# Patient Record
Sex: Female | Born: 1970 | Race: Black or African American | Hispanic: No | Marital: Married | Smoking: Never smoker
Health system: Southern US, Community
[De-identification: ages and names within clinical notes are randomized; demographics above are authoritative.]

## PROBLEM LIST (undated history)

## (undated) DIAGNOSIS — I10 Essential (primary) hypertension: Secondary | ICD-10-CM

## (undated) DIAGNOSIS — F419 Anxiety disorder, unspecified: Secondary | ICD-10-CM

## (undated) DIAGNOSIS — E236 Other disorders of pituitary gland: Secondary | ICD-10-CM

## (undated) DIAGNOSIS — M1990 Unspecified osteoarthritis, unspecified site: Secondary | ICD-10-CM

## (undated) HISTORY — PX: COLONOSCOPY: SHX174

---

## 2000-06-03 ENCOUNTER — Other Ambulatory Visit: Admission: RE | Admit: 2000-06-03 | Discharge: 2000-06-03 | Payer: Self-pay | Admitting: *Deleted

## 2000-06-14 ENCOUNTER — Ambulatory Visit (HOSPITAL_COMMUNITY): Admission: RE | Admit: 2000-06-14 | Discharge: 2000-06-14 | Payer: Self-pay | Admitting: *Deleted

## 2000-06-14 ENCOUNTER — Encounter: Payer: Self-pay | Admitting: *Deleted

## 2001-01-20 ENCOUNTER — Other Ambulatory Visit: Admission: RE | Admit: 2001-01-20 | Discharge: 2001-01-20 | Payer: Self-pay | Admitting: Obstetrics and Gynecology

## 2001-08-26 ENCOUNTER — Inpatient Hospital Stay (HOSPITAL_COMMUNITY): Admission: AD | Admit: 2001-08-26 | Discharge: 2001-08-28 | Payer: Self-pay | Admitting: Obstetrics and Gynecology

## 2001-09-01 ENCOUNTER — Encounter: Admission: RE | Admit: 2001-09-01 | Discharge: 2001-10-01 | Payer: Self-pay | Admitting: Obstetrics and Gynecology

## 2001-10-06 ENCOUNTER — Other Ambulatory Visit: Admission: RE | Admit: 2001-10-06 | Discharge: 2001-10-06 | Payer: Self-pay | Admitting: Obstetrics and Gynecology

## 2002-10-09 ENCOUNTER — Other Ambulatory Visit: Admission: RE | Admit: 2002-10-09 | Discharge: 2002-10-09 | Payer: Self-pay | Admitting: Obstetrics and Gynecology

## 2003-12-20 ENCOUNTER — Other Ambulatory Visit: Admission: RE | Admit: 2003-12-20 | Discharge: 2003-12-20 | Payer: Self-pay | Admitting: Obstetrics and Gynecology

## 2007-09-19 ENCOUNTER — Encounter: Admission: RE | Admit: 2007-09-19 | Discharge: 2007-09-19 | Payer: Self-pay | Admitting: Obstetrics and Gynecology

## 2009-06-16 ENCOUNTER — Inpatient Hospital Stay (HOSPITAL_COMMUNITY): Admission: AD | Admit: 2009-06-16 | Discharge: 2009-06-16 | Payer: Self-pay | Admitting: Obstetrics and Gynecology

## 2009-06-21 ENCOUNTER — Inpatient Hospital Stay (HOSPITAL_COMMUNITY): Admission: AD | Admit: 2009-06-21 | Discharge: 2009-06-23 | Payer: Self-pay | Admitting: Obstetrics & Gynecology

## 2011-01-22 LAB — COMPREHENSIVE METABOLIC PANEL
ALT: 94 U/L — ABNORMAL HIGH (ref 0–35)
AST: 54 U/L — ABNORMAL HIGH (ref 0–37)
Albumin: 2.5 g/dL — ABNORMAL LOW (ref 3.5–5.2)
Alkaline Phosphatase: 186 U/L — ABNORMAL HIGH (ref 39–117)
BUN: 6 mg/dL (ref 6–23)
CO2: 25 mEq/L (ref 19–32)
Calcium: 8.7 mg/dL (ref 8.4–10.5)
Chloride: 105 mEq/L (ref 96–112)
Creatinine, Ser: 1.01 mg/dL (ref 0.4–1.2)
GFR calc Af Amer: >60 mL/min (ref >60)
GFR calc non Af Amer: >60 mL/min (ref >60)
Glucose, Bld: 71 mg/dL (ref 70–99)
Potassium: 4 mEq/L (ref 3.5–5.1)
Sodium: 138 mEq/L (ref 135–145)
Total Bilirubin: 0.4 mg/dL (ref 0.3–1.2)
Total Protein: 6.1 g/dL (ref 6.0–8.3)

## 2011-01-22 LAB — CBC
HCT: 32 % — ABNORMAL LOW (ref 36.0–46.0)
HCT: 33.1 % — ABNORMAL LOW (ref 36.0–46.0)
Hemoglobin: 10.7 g/dL — ABNORMAL LOW (ref 12.0–15.0)
Hemoglobin: 11 g/dL — ABNORMAL LOW (ref 12.0–15.0)
MCHC: 33.2 g/dL (ref 30.0–36.0)
MCHC: 33.5 g/dL (ref 30.0–36.0)
MCV: 98.2 fL (ref 78.0–100.0)
MCV: 98.3 fL (ref 78.0–100.0)
Platelets: 145 10*3/uL — ABNORMAL LOW (ref 150–400)
Platelets: 219 10*3/uL (ref 150–400)
RBC: 3.26 MIL/uL — ABNORMAL LOW (ref 3.87–5.11)
RBC: 3.37 MIL/uL — ABNORMAL LOW (ref 3.87–5.11)
RDW: 13.7 % (ref 11.5–15.5)
RDW: 14.3 % (ref 11.5–15.5)
WBC: 11.8 10*3/uL — ABNORMAL HIGH (ref 4.0–10.5)
WBC: 14.7 10*3/uL — ABNORMAL HIGH (ref 4.0–10.5)

## 2012-03-03 ENCOUNTER — Encounter (HOSPITAL_BASED_OUTPATIENT_CLINIC_OR_DEPARTMENT_OTHER): Payer: Self-pay | Admitting: *Deleted

## 2012-03-03 ENCOUNTER — Emergency Department (HOSPITAL_BASED_OUTPATIENT_CLINIC_OR_DEPARTMENT_OTHER)
Admission: EM | Admit: 2012-03-03 | Discharge: 2012-03-03 | Disposition: A | Discharge status: Home / Self Care | Payer: BC Managed Care – PPO | Attending: Emergency Medicine | Admitting: Emergency Medicine

## 2012-03-03 DIAGNOSIS — R42 Dizziness and giddiness: Secondary | ICD-10-CM | POA: Insufficient documentation

## 2012-03-03 DIAGNOSIS — I10 Essential (primary) hypertension: Secondary | ICD-10-CM | POA: Insufficient documentation

## 2012-03-03 DIAGNOSIS — R51 Headache: Secondary | ICD-10-CM

## 2012-03-03 HISTORY — DX: Other disorders of pituitary gland: E23.6

## 2012-03-03 HISTORY — DX: Essential (primary) hypertension: I10

## 2012-03-03 LAB — DIFFERENTIAL
Basophils Relative: 0 % (ref 0–1)
Eosinophils Absolute: 0.1 10*3/uL (ref 0.0–0.7)
Lymphs Abs: 3.1 10*3/uL (ref 0.7–4.0)
Monocytes Absolute: 0.6 10*3/uL (ref 0.1–1.0)
Monocytes Relative: 8 % (ref 3–12)

## 2012-03-03 LAB — BASIC METABOLIC PANEL
BUN: 15 mg/dL (ref 6–23)
Chloride: 106 mEq/L (ref 96–112)
Creatinine, Ser: 0.9 mg/dL (ref 0.50–1.10)
GFR calc Af Amer: >90 mL/min (ref >90)
Glucose, Bld: 105 mg/dL — ABNORMAL HIGH (ref 70–99)

## 2012-03-03 LAB — CBC
HCT: 36.7 % (ref 36.0–46.0)
Hemoglobin: 13.2 g/dL (ref 12.0–15.0)
MCH: 29.9 pg (ref 26.0–34.0)
MCHC: 36 g/dL (ref 30.0–36.0)
RBC: 4.42 MIL/uL (ref 3.87–5.11)

## 2012-03-03 MED ORDER — SODIUM CHLORIDE 0.9 % IV SOLN
Freq: Once | INTRAVENOUS | Status: AC
  Administered 2012-03-03: 22:00:00 via INTRAVENOUS

## 2012-03-03 MED ORDER — METOCLOPRAMIDE HCL 5 MG/ML IJ SOLN
10.0000 mg | Freq: Once | INTRAMUSCULAR | Status: AC
  Administered 2012-03-03: 10 mg via INTRAVENOUS
  Filled 2012-03-03: qty 2

## 2012-03-03 MED ORDER — DIPHENHYDRAMINE HCL 50 MG/ML IJ SOLN
12.5000 mg | Freq: Once | INTRAMUSCULAR | Status: AC
  Administered 2012-03-03: 12.5 mg via INTRAVENOUS
  Filled 2012-03-03: qty 1

## 2012-03-03 NOTE — ED Notes (Signed)
EDNP Sofia back in to talk with pt prior to d/c

## 2012-03-03 NOTE — ED Notes (Signed)
PA at bedside.

## 2012-03-03 NOTE — ED Provider Notes (Signed)
History     CSN: [id number]  Arrival date & time 03/03/12  4098   First MD Initiated Contact with Patient 03/03/12 2055      Chief Complaint  Patient presents with  . Dizziness    (Consider location/radiation/quality/duration/timing/severity/associated sxs/prior treatment) Patient is a 42 y.o. female presenting with headaches. The history is provided by the patient. No language interpreter was used.  Headache  This is a new problem. The current episode started 3 to 5 hours ago. The problem occurs constantly. The problem has not changed since onset.The headache is associated with nothing. The pain is located in the right unilateral region. The quality of the pain is described as dull. The pain is at a severity of 5/10. The pain is moderate. The pain does not radiate. She has tried acetaminophen for the symptoms. The treatment provided no relief.  Pt has a history of htn,  Currently not on any medicines.  Pt has old rx for hctz.  Pt reports she had elevated bp tonight and took 2 hctz.    Past Medical History  Diagnosis Date  . Enlarged pituitary gland   . Hypertension     History reviewed. No pertinent past surgical history.  No family history on file.  History  Substance Use Topics  . Smoking status: Never Smoker   . Smokeless tobacco: Not on file  . Alcohol Use: No    OB History    Grav Para Term Preterm Abortions TAB SAB Ect Mult Living                  Review of Systems  Neurological: Positive for headaches.  All other systems reviewed and are negative.    Allergies  Review of patient's allergies indicates no known allergies.  Home Medications   Current Outpatient Rx  Name Route Sig Dispense Refill  . ACETAMINOPHEN 500 MG PO TABS Oral Take 1,000 mg by mouth every 6 (six) hours as needed. For pain    . HYDROCHLOROTHIAZIDE 12.5 MG PO CAPS Oral Take 12.5-25 mg by mouth daily.      BP 160/108  Pulse 69  Temp(Src) 98.3 F (36.8 C) (Oral)  Resp 18  Ht 5'  5" (1.651 m)  Wt 158 lb (71.668 kg)  BMI 26.29 kg/m2  SpO2 100%  Physical Exam  Nursing note and vitals reviewed. Constitutional: She is oriented to person, place, and time. She appears well-developed and well-nourished.  HENT:  Head: Normocephalic and atraumatic.  Right Ear: External ear normal.  Nose: Nose normal.  Mouth/Throat: Oropharynx is clear and moist.  Eyes: Conjunctivae and EOM are normal. Pupils are equal, round, and reactive to light.  Neck: Normal range of motion. Neck supple.  Cardiovascular: Normal rate and normal heart sounds.   Pulmonary/Chest: Effort normal.  Abdominal: Soft.  Musculoskeletal: Normal range of motion.  Neurological: She is alert and oriented to person, place, and time. She has normal reflexes.  Skin: Skin is warm.  Psychiatric: She has a normal mood and affect.    ED Course  Procedures (including critical care time)  Labs Reviewed - No data to display No results found.   No diagnosis found.    MDM  Pt given reglan and benadryl .   I will restart hctz.   Pt has a history of pituatary tumor.  I is scheduled to see her MD next week for recheck.        Lonia Skinner [place], [place] 03/03/12 2210

## 2012-03-03 NOTE — ED Notes (Addendum)
Pt reports headache with dizziness since this morning. Pt went to [facility] to check her BP and it was elevated. Denies N/V. No hx of migraines since a child. Pt has been under increased stress with work/family responsibilities.

## 2012-03-03 NOTE — ED Notes (Signed)
Pt took two HCTZ pills (that she had left over but doesn't take on a daily bases) @90min  ago with minimal relief.

## 2012-03-03 NOTE — Discharge Instructions (Signed)
Arterial Hypertension Arterial hypertension (high blood pressure) is a condition of elevated pressure in your blood vessels. Hypertension over a long period of time is a risk factor for strokes, heart attacks, and heart failure. It is also the leading cause of kidney (renal) failure.  CAUSES   In Adults -- Over 90% of all hypertension has no known cause. This is called essential or primary hypertension. In the other 10% of people with hypertension, the increase in blood pressure is caused by another disorder. This is called secondary hypertension. Important causes of secondary hypertension are:   Heavy alcohol use.   Obstructive sleep apnea.   Hyperaldosterosim (Conn's syndrome).   Steroid use.   Chronic kidney failure.   Hyperparathyroidism.   Medications.   Renal artery stenosis.   Pheochromocytoma.   Cushing's disease.   Coarctation of the aorta.   Scleroderma renal crisis.   Licorice (in excessive amounts).   Drugs (cocaine, methamphetamine).  Your caregiver can explain any items above that apply to you.  In Children -- Secondary hypertension is more common and should always be considered.   Pregnancy -- Few women of childbearing age have high blood pressure. However, up to 10% of them develop hypertension of pregnancy. Generally, this will not harm the woman. It may be a sign of 3 complications of pregnancy: preeclampsia, HELLP syndrome, and eclampsia. Follow up and control with medication is necessary.  SYMPTOMS   This condition normally does not produce any noticeable symptoms. It is usually found during a routine exam.   Malignant hypertension is a late problem of high blood pressure. It may have the following symptoms:   Headaches.   Blurred vision.   End-organ damage (this means your kidneys, heart, lungs, and other organs are being damaged).   Stressful situations can increase the blood pressure. If a person with normal blood pressure has their blood  pressure go up while being seen by their caregiver, this is often termed "white coat hypertension." Its importance is not known. It may be related with eventually developing hypertension or complications of hypertension.   Hypertension is often confused with mental tension, stress, and anxiety.  DIAGNOSIS  The diagnosis is made by 3 separate blood pressure measurements. They are taken at least 1 week apart from each other. If there is organ damage from hypertension, the diagnosis may be made without repeat measurements. Hypertension is usually identified by having blood pressure readings:  Above 140/90 mmHg measured in both arms, at 3 separate times, over a couple weeks.   Over 130/80 mmHg should be considered a risk factor and may require treatment in patients with diabetes.  Blood pressure readings over 120/80 mmHg are called "pre-hypertension" even in non-diabetic patients. To get a true blood pressure measurement, use the following guidelines. Be aware of the factors that can alter blood pressure readings.  Take measurements at least 1 hour after caffeine.   Take measurements 30 minutes after smoking and without any stress. This is another reason to quit smoking - it raises your blood pressure.   Use a proper cuff size. Ask your caregiver if you are not sure about your cuff size.   Most home blood pressure cuffs are automatic. They will measure systolic and diastolic pressures. The systolic pressure is the pressure reading at the start of sounds. Diastolic pressure is the pressure at which the sounds disappear. If you are elderly, measure pressures in multiple postures. Try sitting, lying or standing.   Sit at rest for a minimum of   5 minutes before taking measurements.   You should not be on any medications like decongestants. These are found in many cold medications.   Record your blood pressure readings and review them with your caregiver.  If you have hypertension:  Your caregiver  may do tests to be sure you do not have secondary hypertension (see "causes" above).   Your caregiver may also look for signs of metabolic syndrome. This is also called Syndrome X or Insulin Resistance Syndrome. You may have this syndrome if you have type 2 diabetes, abdominal obesity, and abnormal blood lipids in addition to hypertension.   Your caregiver will take your medical and family history and perform a physical exam.   Diagnostic tests may include blood tests (for glucose, cholesterol, potassium, and kidney function), a urinalysis, or an EKG. Other tests may also be necessary depending on your condition.  PREVENTION  There are important lifestyle issues that you can adopt to reduce your chance of developing hypertension:  Maintain a normal weight.   Limit the amount of salt (sodium) in your diet.   Exercise often.   Limit alcohol intake.   Get enough potassium in your diet. Discuss specific advice with your caregiver.   Follow a DASH diet (dietary approaches to stop hypertension). This diet is rich in fruits, vegetables, and low-fat dairy products, and avoids certain fats.  PROGNOSIS  Essential hypertension cannot be cured. Lifestyle changes and medical treatment can lower blood pressure and reduce complications. The prognosis of secondary hypertension depends on the underlying cause. Many people whose hypertension is controlled with medicine or lifestyle changes can live a normal, healthy life.  RISKS AND COMPLICATIONS  While high blood pressure alone is not an illness, it often requires treatment due to its short- and long-term effects on many organs. Hypertension increases your risk for:  CVAs or strokes (cerebrovascular accident).   Heart failure due to chronically high blood pressure (hypertensive cardiomyopathy).   Heart attack (myocardial infarction).   Damage to the retina (hypertensive retinopathy).   Kidney failure (hypertensive nephropathy).  Your caregiver can  explain list items above that apply to you. Treatment of hypertension can significantly reduce the risk of complications. TREATMENT   For overweight patients, weight loss and regular exercise are recommended. Physical fitness lowers blood pressure.   Mild hypertension is usually treated with diet and exercise. A diet rich in fruits and vegetables, fat-free dairy products, and foods low in fat and salt (sodium) can help lower blood pressure. Decreasing salt intake decreases blood pressure in a 1/3 of people.   Stop smoking if you are a smoker.  The steps above are highly effective in reducing blood pressure. While these actions are easy to suggest, they are difficult to achieve. Most patients with moderate or severe hypertension end up requiring medications to bring their blood pressure down to a normal level. There are several classes of medications for treatment. Blood pressure pills (antihypertensives) will lower blood pressure by their different actions. Lowering the blood pressure by 10 mmHg may decrease the risk of complications by as much as 25%. The goal of treatment is effective blood pressure control. This will reduce your risk for complications. Your caregiver will help you determine the best treatment for you according to your lifestyle. What is excellent treatment for one person, may not be for you. HOME CARE INSTRUCTIONS   Do not smoke.   Follow the lifestyle changes outlined in the "Prevention" section.   If you are on medications, follow the directions   carefully. Blood pressure medications must be taken as prescribed. Skipping doses reduces their benefit. It also puts you at risk for problems.   Follow up with your caregiver, as directed.   If you are asked to monitor your blood pressure at home, follow the guidelines in the "Diagnosis" section above.  SEEK MEDICAL CARE IF:   You think you are having medication side effects.   You have recurrent headaches or lightheadedness.     You have swelling in your ankles.   You have trouble with your vision.  SEEK IMMEDIATE MEDICAL CARE IF:   You have sudden onset of chest pain or pressure, difficulty breathing, or other symptoms of a heart attack.   You have a severe headache.   You have symptoms of a stroke (such as sudden weakness, difficulty speaking, difficulty walking).  MAKE SURE YOU:   Understand these instructions.   Will watch your condition.   Will get help right away if you are not doing well or get worse.  Document Released: 10/04/2005 Document Revised: 09/23/2011 Document Reviewed: 05/04/2007 ExitCare Patient Information 2012 [employer].General Headache, Without Cause A general headache has no specific cause. These headaches are not life-threatening. They will not lead to other types of headaches. HOME CARE   Make and keep follow-up visits with your doctor.   Only take medicine as told by your doctor.   Try to relax, get a massage, or use your thoughts to control your body (biofeedback).   Apply cold or heat to the head and neck. Apply 3 or 4 times a day or as needed.  Finding out the results of your test Ask when your test results will be ready. Make sure you get your test results. GET HELP RIGHT AWAY IF:   You have problems with medicine.   Your medicine does not help relieve pain.   Your headache changes or becomes worse.   You feel sick to your stomach (nauseous) or throw up (vomit).   You have a temperature by mouth above 102 F (38.9 C), not controlled by medicine.   Your have a stiff neck.   You have vision loss.   You have muscle weakness.   You lose control of your muscles.   You lose balance or have trouble walking.   You feel like you are going to pass out (faint).  MAKE SURE YOU:   Understand these instructions.   Will watch this condition.   Will get help right away if you are not doing well or get worse.  Document Released: 07/13/2008 Document Revised:  09/23/2011 Document Reviewed: 07/13/2008 ExitCare Patient Information 2012 [employer]. 

## 2012-03-03 NOTE — ED Provider Notes (Signed)
History/physical exam/procedure(s) were performed by non-physician practitioner and as supervising physician I was immediately available for consultation/collaboration. I have reviewed all notes and am in agreement with care and plan.   Sidni Fusco S Ralphael [place], MD 03/03/12 2315 

## 2012-03-27 ENCOUNTER — Other Ambulatory Visit: Payer: Self-pay | Admitting: Obstetrics and Gynecology

## 2012-03-30 ENCOUNTER — Ambulatory Visit
Admission: RE | Admit: 2012-03-30 | Discharge: 2012-03-30 | Disposition: A | Discharge status: Home / Self Care | Payer: BC Managed Care – PPO | Source: Ambulatory Visit | Attending: Obstetrics and Gynecology | Admitting: Obstetrics and Gynecology

## 2012-03-30 MED ORDER — GADOBENATE DIMEGLUMINE 529 MG/ML IV SOLN
9.0000 mL | Freq: Once | INTRAVENOUS | Status: AC | PRN
  Administered 2012-03-30: 9 mL via INTRAVENOUS

## 2012-04-03 ENCOUNTER — Other Ambulatory Visit: Payer: BC Managed Care – PPO

## 2015-03-20 ENCOUNTER — Encounter (HOSPITAL_COMMUNITY): Payer: Self-pay | Admitting: Emergency Medicine

## 2015-03-20 ENCOUNTER — Emergency Department (HOSPITAL_COMMUNITY)
Admission: EM | Admit: 2015-03-20 | Discharge: 2015-03-20 | Disposition: A | Discharge status: Home / Self Care | Payer: BLUE CROSS/BLUE SHIELD | Attending: Emergency Medicine | Admitting: Emergency Medicine

## 2015-03-20 ENCOUNTER — Emergency Department (HOSPITAL_COMMUNITY): Payer: BLUE CROSS/BLUE SHIELD

## 2015-03-20 DIAGNOSIS — Z8639 Personal history of other endocrine, nutritional and metabolic disease: Secondary | ICD-10-CM | POA: Insufficient documentation

## 2015-03-20 DIAGNOSIS — Z79899 Other long term (current) drug therapy: Secondary | ICD-10-CM | POA: Insufficient documentation

## 2015-03-20 DIAGNOSIS — K5732 Diverticulitis of large intestine without perforation or abscess without bleeding: Secondary | ICD-10-CM | POA: Insufficient documentation

## 2015-03-20 DIAGNOSIS — R51 Headache: Secondary | ICD-10-CM | POA: Diagnosis not present

## 2015-03-20 DIAGNOSIS — R42 Dizziness and giddiness: Secondary | ICD-10-CM | POA: Diagnosis not present

## 2015-03-20 DIAGNOSIS — I10 Essential (primary) hypertension: Secondary | ICD-10-CM | POA: Diagnosis not present

## 2015-03-20 DIAGNOSIS — R110 Nausea: Secondary | ICD-10-CM | POA: Diagnosis present

## 2015-03-20 DIAGNOSIS — Z3202 Encounter for pregnancy test, result negative: Secondary | ICD-10-CM | POA: Insufficient documentation

## 2015-03-20 LAB — LIPASE, BLOOD: LIPASE: 25 U/L (ref 22–51)

## 2015-03-20 LAB — CBC WITH DIFFERENTIAL/PLATELET
Basophils Absolute: 0 10*3/uL (ref 0.0–0.1)
Basophils Relative: 0 % (ref 0–1)
EOS PCT: 1 % (ref 0–5)
Eosinophils Absolute: 0.1 10*3/uL (ref 0.0–0.7)
HEMATOCRIT: 36.6 % (ref 36.0–46.0)
HEMOGLOBIN: 12.9 g/dL (ref 12.0–15.0)
LYMPHS ABS: 1.5 10*3/uL (ref 0.7–4.0)
Lymphocytes Relative: 11 % — ABNORMAL LOW (ref 12–46)
MCH: 29.7 pg (ref 26.0–34.0)
MCHC: 35.2 g/dL (ref 30.0–36.0)
MCV: 84.3 fL (ref 78.0–100.0)
Monocytes Absolute: 1.1 10*3/uL — ABNORMAL HIGH (ref 0.1–1.0)
Monocytes Relative: 8 % (ref 3–12)
Neutro Abs: 11.5 10*3/uL — ABNORMAL HIGH (ref 1.7–7.7)
Neutrophils Relative %: 80 % — ABNORMAL HIGH (ref 43–77)
PLATELETS: 229 10*3/uL (ref 150–400)
RBC: 4.34 MIL/uL (ref 3.87–5.11)
RDW: 12.3 % (ref 11.5–15.5)
WBC: 14.1 10*3/uL — AB (ref 4.0–10.5)

## 2015-03-20 LAB — COMPREHENSIVE METABOLIC PANEL
ALBUMIN: 4.1 g/dL (ref 3.5–5.0)
ALT: 11 U/L — ABNORMAL LOW (ref 14–54)
AST: 19 U/L (ref 15–41)
Alkaline Phosphatase: 50 U/L (ref 38–126)
Anion gap: 9 (ref 5–15)
BUN: 7 mg/dL (ref 6–20)
CALCIUM: 9.7 mg/dL (ref 8.9–10.3)
CHLORIDE: 100 mmol/L — AB (ref 101–111)
CO2: 28 mmol/L (ref 22–32)
Creatinine, Ser: 1.13 mg/dL — ABNORMAL HIGH (ref 0.44–1.00)
GFR calc non Af Amer: 59 mL/min — ABNORMAL LOW (ref >60)
Glucose, Bld: 101 mg/dL — ABNORMAL HIGH (ref 65–99)
POTASSIUM: 3.6 mmol/L (ref 3.5–5.1)
SODIUM: 137 mmol/L (ref 135–145)
TOTAL PROTEIN: 7.7 g/dL (ref 6.5–8.1)
Total Bilirubin: 0.6 mg/dL (ref 0.3–1.2)

## 2015-03-20 LAB — PREGNANCY, URINE: Preg Test, Ur: NEGATIVE

## 2015-03-20 LAB — WET PREP, GENITAL
CLUE CELLS WET PREP: NONE SEEN
Trich, Wet Prep: NONE SEEN
Yeast Wet Prep HPF POC: NONE SEEN

## 2015-03-20 LAB — URINALYSIS, ROUTINE W REFLEX MICROSCOPIC
BILIRUBIN URINE: NEGATIVE
GLUCOSE, UA: NEGATIVE mg/dL
Hgb urine dipstick: NEGATIVE
KETONES UR: NEGATIVE mg/dL
Leukocytes, UA: NEGATIVE
NITRITE: NEGATIVE
PH: 6.5 (ref 5.0–8.0)
PROTEIN: NEGATIVE mg/dL
SPECIFIC GRAVITY, URINE: 1.009 (ref 1.005–1.030)
Urobilinogen, UA: 0.2 mg/dL (ref 0.0–1.0)

## 2015-03-20 MED ORDER — ONDANSETRON HCL 4 MG/2ML IJ SOLN
4.0000 mg | Freq: Once | INTRAMUSCULAR | Status: AC
  Administered 2015-03-20: 4 mg via INTRAVENOUS
  Filled 2015-03-20: qty 2

## 2015-03-20 MED ORDER — METRONIDAZOLE 500 MG PO TABS: 500.0000 mg | ORAL_TABLET | Freq: Two times a day (BID) | ORAL | Status: DC

## 2015-03-20 MED ORDER — HYDROCODONE-ACETAMINOPHEN 5-325 MG PO TABS: 2.0000 | ORAL_TABLET | Freq: Four times a day (QID) | ORAL | Status: DC | PRN

## 2015-03-20 MED ORDER — METRONIDAZOLE IN NACL 5-0.79 MG/ML-% IV SOLN
500.0000 mg | Freq: Once | INTRAVENOUS | Status: AC
  Administered 2015-03-20: 500 mg via INTRAVENOUS
  Filled 2015-03-20: qty 100

## 2015-03-20 MED ORDER — IOHEXOL 300 MG/ML  SOLN
25.0000 mL | Freq: Once | INTRAMUSCULAR | Status: AC | PRN
  Administered 2015-03-20: 25 mL via ORAL

## 2015-03-20 MED ORDER — ONDANSETRON HCL 4 MG PO TABS: 4.0000 mg | ORAL_TABLET | Freq: Four times a day (QID) | ORAL | Status: DC

## 2015-03-20 MED ORDER — ONDANSETRON 4 MG PO TBDP
4.0000 mg | ORAL_TABLET | Freq: Once | ORAL | Status: AC
  Administered 2015-03-20: 4 mg via ORAL
  Filled 2015-03-20: qty 1

## 2015-03-20 MED ORDER — IOHEXOL 300 MG/ML  SOLN
100.0000 mL | Freq: Once | INTRAMUSCULAR | Status: AC | PRN
  Administered 2015-03-20: 100 mL via INTRAVENOUS

## 2015-03-20 MED ORDER — MORPHINE SULFATE 4 MG/ML IJ SOLN
4.0000 mg | Freq: Once | INTRAMUSCULAR | Status: AC
  Administered 2015-03-20: 4 mg via INTRAVENOUS
  Filled 2015-03-20: qty 1

## 2015-03-20 MED ORDER — CIPROFLOXACIN HCL 500 MG PO TABS: 500.0000 mg | ORAL_TABLET | Freq: Two times a day (BID) | ORAL | Status: DC

## 2015-03-20 MED ORDER — CIPROFLOXACIN IN D5W 400 MG/200ML IV SOLN
400.0000 mg | Freq: Once | INTRAVENOUS | Status: AC
  Administered 2015-03-20: 400 mg via INTRAVENOUS
  Filled 2015-03-20: qty 200

## 2015-03-20 NOTE — ED Notes (Signed)
This RN called Ct to determine when pt will be transported to CT and was informed patient is next. Pt updated.

## 2015-03-20 NOTE — Discharge Instructions (Signed)

## 2015-03-20 NOTE — ED Provider Notes (Signed)
CSN: [id number]     Arrival date & time 03/20/15  1225 History  This chart was scribed for non-physician practitioner Fayrene Helper, PA-C working with Cathren Laine, MD by Lyndel Safe, ED Scribe. This patient was seen in room TR02C/TR02C and the patient's care was started at 2:46 PM.   Chief Complaint  Patient presents with  . Abdominal Pain  . Nausea   Patient is a 44 y.o. female presenting with abdominal pain. The history is provided by the patient. No language interpreter was used.  Abdominal Pain Pain location:  Suprapubic Pain quality: sharp and throbbing   Pain radiates to:  Back Pain severity:  Moderate Onset quality:  Sudden Duration:  4 days Timing:  Constant Chronicity:  New Relieved by:  Nothing Worsened by:  Nothing tried Ineffective treatments:  Acetaminophen Associated symptoms: nausea   Associated symptoms: no chest pain, no chills, no constipation, no diarrhea, no dysuria, no fever, no vaginal bleeding, no vaginal discharge and no vomiting     HPI Comments: Jenny Wang is a 44 y.o. female, with a PMhx of HTN, who presents to the Emergency Department complaining of constant, moderate, throbbing, lower-left abdominal pain that occasionally worsens to a sharp pain, onset 4 days ago. Pt reports associated lower back pain, headache, dizziness, and nausea. She describes the abdominal pain to be similar to food poisoning. Intially the pt believed the pain to be associated with her menstrual cycle but this concern has since resolved. She has taken ibuprofen pta with no relief. There are no pertinent aggravating factors. She notes that she is currently sexually active with her husband, who has had a vasectomy, but reports no pain during sexual activity. She reports her last normal BM to be this morning. The pt is followed by a PCP. She denies any known allergies. She denies constipation, diarrhea, blood in stool, changes in urination, vomiting, CP, night sweats, or fevers.    Past  Medical History  Diagnosis Date  . Enlarged pituitary gland   . Hypertension    History reviewed. No pertinent past surgical history. No family history on file. History  Substance Use Topics  . Smoking status: Never Smoker   . Smokeless tobacco: Not on file  . Alcohol Use: No   OB History    No data available     Review of Systems  Constitutional: Negative for fever and chills.  Cardiovascular: Negative for chest pain.  Gastrointestinal: Positive for nausea and abdominal pain. Negative for vomiting, diarrhea, constipation and blood in stool.  Genitourinary: Negative for dysuria, urgency, frequency, decreased urine volume, vaginal bleeding and vaginal discharge.  Musculoskeletal: Positive for back pain ( lower).  Neurological: Positive for dizziness and headaches.    Allergies  Review of patient's allergies indicates no known allergies.  Home Medications   Prior to Admission medications   Medication Sig Start Date End Date Taking? Authorizing Provider  acetaminophen (TYLENOL) 500 MG tablet Take 1,000 mg by mouth every 6 (six) hours as needed. For pain    Historical Provider, MD  hydrochlorothiazide (MICROZIDE) 12.5 MG capsule Take 12.5-25 mg by mouth daily.    Historical Provider, MD   BP 145/91 mmHg  Pulse 80  Temp(Src) 97.7 F (36.5 C) (Oral)  Resp 14  Ht  (1.651 m)  Wt 145 lb (65.772 kg)  BMI 24.13 kg/m2  SpO2 99%  LMP 01/18/2015  Physical Exam  Constitutional: She appears well-developed and well-nourished.  HENT:  Head: Normocephalic and atraumatic.  Eyes: Conjunctivae are  normal. Right eye exhibits no discharge. Left eye exhibits no discharge. No scleral icterus.  Cardiovascular: Normal rate, regular rhythm and normal heart sounds.   Pulmonary/Chest: Effort normal and breath sounds normal. No respiratory distress.  Abdominal: Soft. Bowel sounds are normal. There is tenderness.  TTP to left lower quadrant.  Abdomen mildly distended.   Genitourinary:   Chaperone present during exam. Normal external genitalia, vaginal canal with moderate whitish discharge. Cervical os is incompletely visualized but free of lesion or rash. On bimanual examination, no adnexal tenderness or cervical motion tenderness.  Musculoskeletal: She exhibits no tenderness.  Tenderness to paraspinal muscles on percussion.     Neurological: She is alert. Coordination normal.  Skin: Skin is warm and dry. No rash noted. She is not diaphoretic. No erythema.  Psychiatric: She has a normal mood and affect.  Nursing note and vitals reviewed.   ED Course  Procedures  DIAGNOSTIC STUDIES: Oxygen Saturation is 99% on RA, normal by my interpretation.    COORDINATION OF CARE: 2:53 PM Discussed treatment plan which includes to get diagnostic testing and order pain medication with pt. Pt acknowledges and agrees to plan.   3:37 PM Pelvic exam performed with chaperone present during exam.  3:41 PM No significant discomfort with pelvic examination, low suspicion for GU etiology such as ovarian torsion, TOA, ovarian cysts, endometriosis, uterine fibroid. Patient does have an elevated white count and left lower quadrant abdominal pain. Abdominal CT scan ordered to rule out acute emergent condition including colitis, diverticulitis, appendicitis or other emergent condition. Pain medication given.  6:25 PM Abdominal and pelvis CT scan demonstrated evidence of diverticulitis at the distal colon without evidence of perforations or abscess. Patient still endorsed pain however no vomiting. The patient will receive IV antibiotic in the ED along with pain medication but I anticipate she would be able to be discharged with antibiotic and close follow-up. Patient voiced understanding and agrees with plan.  Labs Review Labs Reviewed  WET PREP, GENITAL - Abnormal; Notable for the following:    WBC, Wet Prep HPF POC FEW (*)    All other components within normal limits  CBC WITH  DIFFERENTIAL/PLATELET - Abnormal; Notable for the following:    WBC 14.1 (*)    Neutrophils Relative % 80 (*)    Neutro Abs 11.5 (*)    Lymphocytes Relative 11 (*)    Monocytes Absolute 1.1 (*)    All other components within normal limits  COMPREHENSIVE METABOLIC PANEL - Abnormal; Notable for the following:    Chloride 100 (*)    Glucose, Bld 101 (*)    Creatinine, Ser 1.13 (*)    ALT 11 (*)    GFR calc non Af Amer 59 (*)    All other components within normal limits  LIPASE, BLOOD  URINALYSIS, ROUTINE W REFLEX MICROSCOPIC (NOT AT [facility])  PREGNANCY, URINE  RPR  HIV ANTIBODY (ROUTINE TESTING)  POC URINE PREG, ED  GC/CHLAMYDIA PROBE AMP ([place]) NOT AT [facility]    Imaging Review Ct Abdomen Pelvis W Contrast  03/20/2015   CLINICAL DATA:  Lower abdominal pain and pelvic pain for 4 days. History of hypertension.  EXAM: CT ABDOMEN AND PELVIS WITH CONTRAST  TECHNIQUE: Multidetector CT imaging of the abdomen and pelvis was performed using the standard protocol following bolus administration of intravenous contrast.  CONTRAST:  100mL OMNIPAQUE IOHEXOL 300 MG/ML  SOLN  COMPARISON:  None  FINDINGS: Lower chest:  The lung bases are clear.  No pleural effusion.  Hepatobiliary:  No suspicious liver abnormality. The gallbladder appears normal. No biliary dilatation.  Pancreas: Unremarkable appearance of the pancreas.  Spleen: Negative  Adrenals/Urinary Tract: The adrenal glands are both normal. The right kidney is within normal limits. The left kidney is also normal. The urinary bladder is within normal limits.  Stomach/Bowel: The stomach is within normal limits. The small bowel loops have a normal course and caliber. No obstruction. The appendix is visualized and appears normal. There is abnormal wall thickening and inflammation involving the distal descending colon consistent with acute diverticulitis. Inflamed diverticula is identified on image 45 of series 3. No significant free intraperitoneal air. There  is no abscess identified.  Vascular/Lymphatic: Normal appearance of the abdominal aorta. No enlarged retroperitoneal or mesenteric adenopathy. No enlarged pelvic or inguinal lymph nodes.  Reproductive: There is normal appearance of the uterus and adnexal structures.  Other: A small amount of free fluid is identified within the left lower quadrant of the abdomen. No fluid collections identified.  Musculoskeletal: There is no aggressive lytic or sclerotic bone lesions identified.  IMPRESSION: 1. Acute diverticulitis involves the distal descending colon. No abscess identified.   Electronically Signed   By: Signa Kell M.D.   On: 03/20/2015 17:50     EKG Interpretation None      MDM   Final diagnoses:  Diverticulitis of large intestine without perforation or abscess without bleeding    BP 129/81 mmHg  Pulse 67  Temp(Src) 97.7 F (36.5 C) (Oral)  Resp 21  Ht  (1.651 m)  Wt 145 lb (65.772 kg)  BMI 24.13 kg/m2  SpO2 99%  LMP 01/18/2015  I personally performed the services described in this documentation, which was scribed in my presence. The recorded information has been reviewed and is accurate.     Fayrene Helper, PA-C 03/20/15 2056  Cathren Laine, MD 03/21/15 [phone]

## 2015-03-20 NOTE — ED Notes (Signed)
Patient transported to CT 

## 2015-03-20 NOTE — ED Notes (Signed)
Pt up and and walking around POD F. She states she is feeling better and wants to go home to lay in her bed. Pt A&OX4, ambulatory at d/c with steady gait, NAD

## 2015-03-20 NOTE — ED Notes (Signed)
Pt reports nausea upon getting up from the stretcher to get dressed. Pt vomiting x 1. Zofran ODT [place]

## 2015-03-20 NOTE — ED Notes (Signed)
abd pain started 4 days ago -- thought that it was period starting, but hasn't started-- states feels like "contractions" -- no diarrhea, no vomiting--

## 2015-03-21 LAB — GC/CHLAMYDIA PROBE AMP (~~LOC~~) NOT AT ARMC
Chlamydia: NEGATIVE
NEISSERIA GONORRHEA: NEGATIVE

## 2015-03-21 LAB — RPR: RPR: NONREACTIVE

## 2015-03-21 LAB — HIV ANTIBODY (ROUTINE TESTING W REFLEX): HIV SCREEN 4TH GENERATION: NONREACTIVE

## 2015-09-08 ENCOUNTER — Other Ambulatory Visit: Payer: Self-pay | Admitting: Sports Medicine

## 2015-09-08 DIAGNOSIS — M79644 Pain in right finger(s): Secondary | ICD-10-CM

## 2015-09-22 ENCOUNTER — Ambulatory Visit
Admission: RE | Admit: 2015-09-22 | Discharge: 2015-09-22 | Disposition: A | Discharge status: Home / Self Care | Payer: BLUE CROSS/BLUE SHIELD | Source: Ambulatory Visit | Attending: Sports Medicine | Admitting: Sports Medicine

## 2015-09-22 DIAGNOSIS — M79644 Pain in right finger(s): Secondary | ICD-10-CM

## 2016-03-05 ENCOUNTER — Emergency Department (HOSPITAL_COMMUNITY)
Admission: EM | Admit: 2016-03-05 | Discharge: 2016-03-05 | Disposition: A | Discharge status: Home / Self Care | Payer: BLUE CROSS/BLUE SHIELD | Attending: Emergency Medicine | Admitting: Emergency Medicine

## 2016-03-05 ENCOUNTER — Encounter (HOSPITAL_COMMUNITY): Payer: Self-pay

## 2016-03-05 DIAGNOSIS — Y9389 Activity, other specified: Secondary | ICD-10-CM | POA: Insufficient documentation

## 2016-03-05 DIAGNOSIS — I10 Essential (primary) hypertension: Secondary | ICD-10-CM

## 2016-03-05 DIAGNOSIS — S29002A Unspecified injury of muscle and tendon of back wall of thorax, initial encounter: Secondary | ICD-10-CM | POA: Diagnosis present

## 2016-03-05 DIAGNOSIS — Y999 Unspecified external cause status: Secondary | ICD-10-CM | POA: Insufficient documentation

## 2016-03-05 DIAGNOSIS — S29019A Strain of muscle and tendon of unspecified wall of thorax, initial encounter: Secondary | ICD-10-CM

## 2016-03-05 DIAGNOSIS — Z7984 Long term (current) use of oral hypoglycemic drugs: Secondary | ICD-10-CM | POA: Insufficient documentation

## 2016-03-05 DIAGNOSIS — Z792 Long term (current) use of antibiotics: Secondary | ICD-10-CM | POA: Insufficient documentation

## 2016-03-05 DIAGNOSIS — S29012A Strain of muscle and tendon of back wall of thorax, initial encounter: Secondary | ICD-10-CM | POA: Insufficient documentation

## 2016-03-05 DIAGNOSIS — Y92410 Unspecified street and highway as the place of occurrence of the external cause: Secondary | ICD-10-CM | POA: Insufficient documentation

## 2016-03-05 MED ORDER — CYCLOBENZAPRINE HCL 5 MG PO TABS: 5.0000 mg | ORAL_TABLET | Freq: Three times a day (TID) | ORAL | Status: DC | PRN

## 2016-03-05 MED ORDER — NAPROXEN 375 MG PO TABS: 375.0000 mg | ORAL_TABLET | Freq: Two times a day (BID) | ORAL | Status: DC
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
## 2016-04-29 NOTE — ED Provider Notes (Signed)
Medical screening examination/treatment/procedure(s) were performed by non-physician practitioner and as supervising physician I was immediately available for consultation/collaboration.   EKG Interpretation None       Jacalyn LefevreJulie Elliot Meldrum, MD 04/29/16 1434

## 2016-10-26 ENCOUNTER — Other Ambulatory Visit: Payer: Self-pay | Admitting: Obstetrics and Gynecology

## 2016-10-26 DIAGNOSIS — E229 Hyperfunction of pituitary gland, unspecified: Principal | ICD-10-CM

## 2016-10-26 DIAGNOSIS — R7989 Other specified abnormal findings of blood chemistry: Secondary | ICD-10-CM

## 2016-11-03 ENCOUNTER — Other Ambulatory Visit: Payer: BLUE CROSS/BLUE SHIELD

## 2016-11-26 ENCOUNTER — Ambulatory Visit
Admission: RE | Admit: 2016-11-26 | Discharge: 2016-11-26 | Disposition: A | Discharge status: Home / Self Care | Payer: BLUE CROSS/BLUE SHIELD | Source: Ambulatory Visit | Attending: Obstetrics and Gynecology | Admitting: Obstetrics and Gynecology

## 2016-11-26 DIAGNOSIS — E229 Hyperfunction of pituitary gland, unspecified: Principal | ICD-10-CM

## 2016-11-26 DIAGNOSIS — R7989 Other specified abnormal findings of blood chemistry: Secondary | ICD-10-CM

## 2016-11-26 MED ORDER — GADOBENATE DIMEGLUMINE 529 MG/ML IV SOLN
7.0000 mL | Freq: Once | INTRAVENOUS | Status: AC | PRN
  Administered 2016-11-26: 7 mL via INTRAVENOUS

## 2018-03-15 ENCOUNTER — Other Ambulatory Visit: Payer: Self-pay | Admitting: Obstetrics and Gynecology

## 2018-03-15 DIAGNOSIS — E221 Hyperprolactinemia: Secondary | ICD-10-CM

## 2018-03-19 ENCOUNTER — Other Ambulatory Visit: Payer: BLUE CROSS/BLUE SHIELD

## 2018-03-24 ENCOUNTER — Ambulatory Visit
Admission: RE | Admit: 2018-03-24 | Discharge: 2018-03-24 | Disposition: A | Discharge status: Home / Self Care | Payer: BLUE CROSS/BLUE SHIELD | Source: Ambulatory Visit | Attending: Obstetrics and Gynecology | Admitting: Obstetrics and Gynecology

## 2018-03-24 DIAGNOSIS — E221 Hyperprolactinemia: Secondary | ICD-10-CM

## 2018-03-24 MED ORDER — GADOBENATE DIMEGLUMINE 529 MG/ML IV SOLN
7.0000 mL | Freq: Once | INTRAVENOUS | Status: AC | PRN
  Administered 2018-03-24: 7 mL via INTRAVENOUS

## 2019-07-02 ENCOUNTER — Other Ambulatory Visit: Payer: Self-pay | Admitting: Family Medicine

## 2019-07-02 ENCOUNTER — Ambulatory Visit
Admission: RE | Admit: 2019-07-02 | Discharge: 2019-07-02 | Disposition: A | Discharge status: Home / Self Care | Payer: BLUE CROSS/BLUE SHIELD | Source: Ambulatory Visit | Attending: Family Medicine | Admitting: Family Medicine

## 2019-07-02 DIAGNOSIS — R52 Pain, unspecified: Secondary | ICD-10-CM

## 2020-08-14 IMAGING — CR DG LUMBAR SPINE COMPLETE 4+V
5 series · 5 of 5 positions shown · non-contrast
Comparison: CT 03/20/2015

CLINICAL DATA: Back pain

EXAM:
LUMBAR SPINE - COMPLETE 4+ VIEW

[t l-spine a.p.]
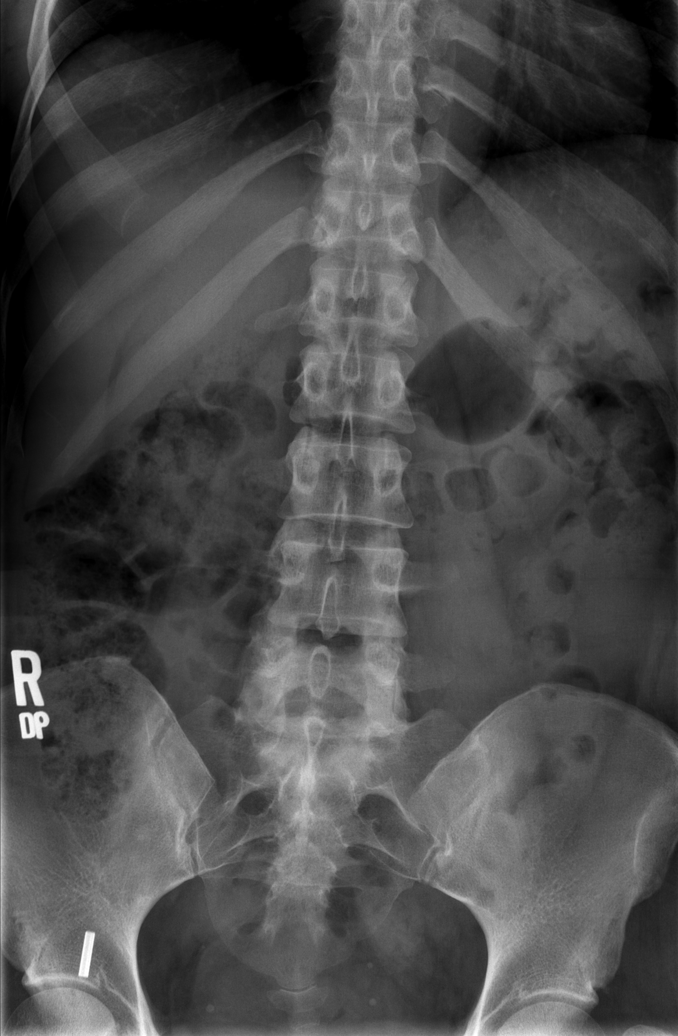

[t l-spine oblique exposure (1 of 2)]
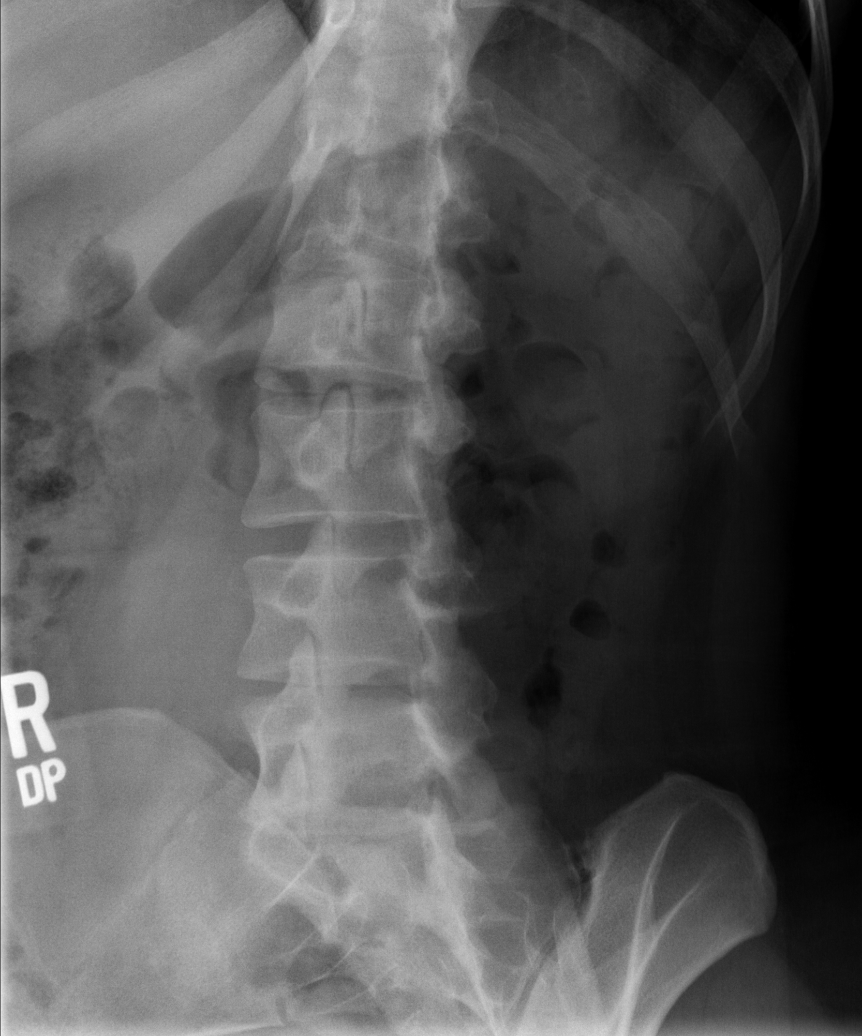

[t l-spine oblique exposure (2 of 2)]
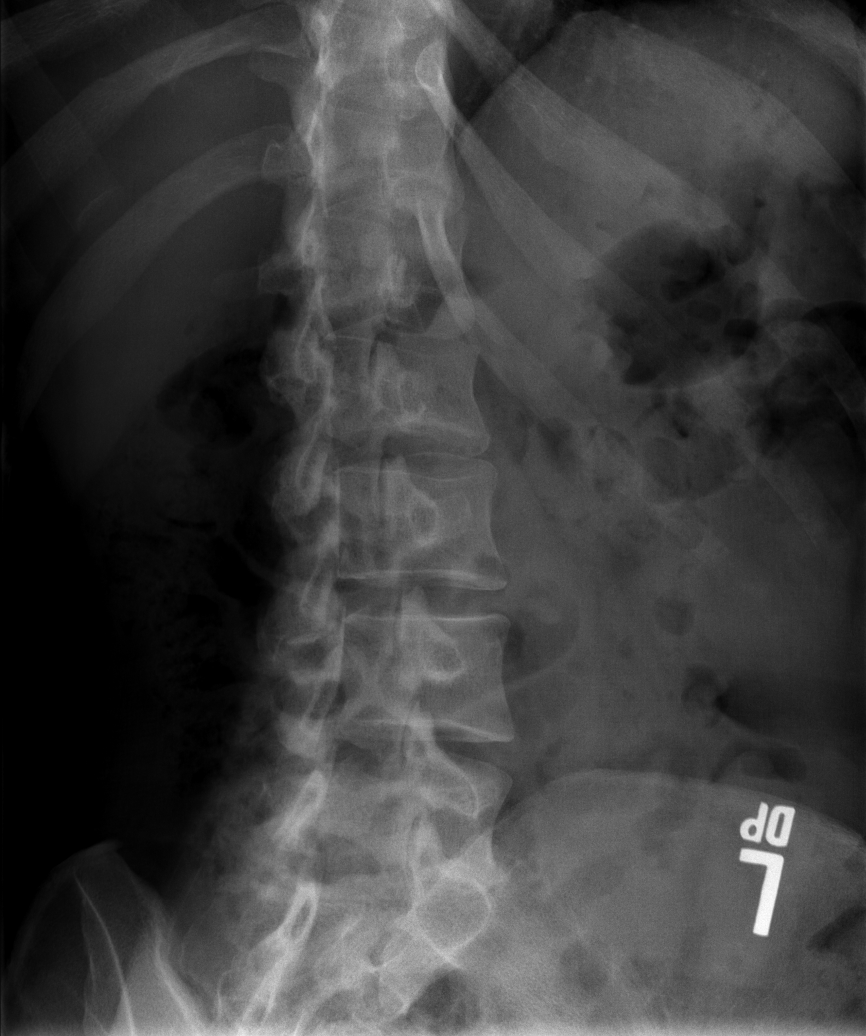

[t l-spine lat]
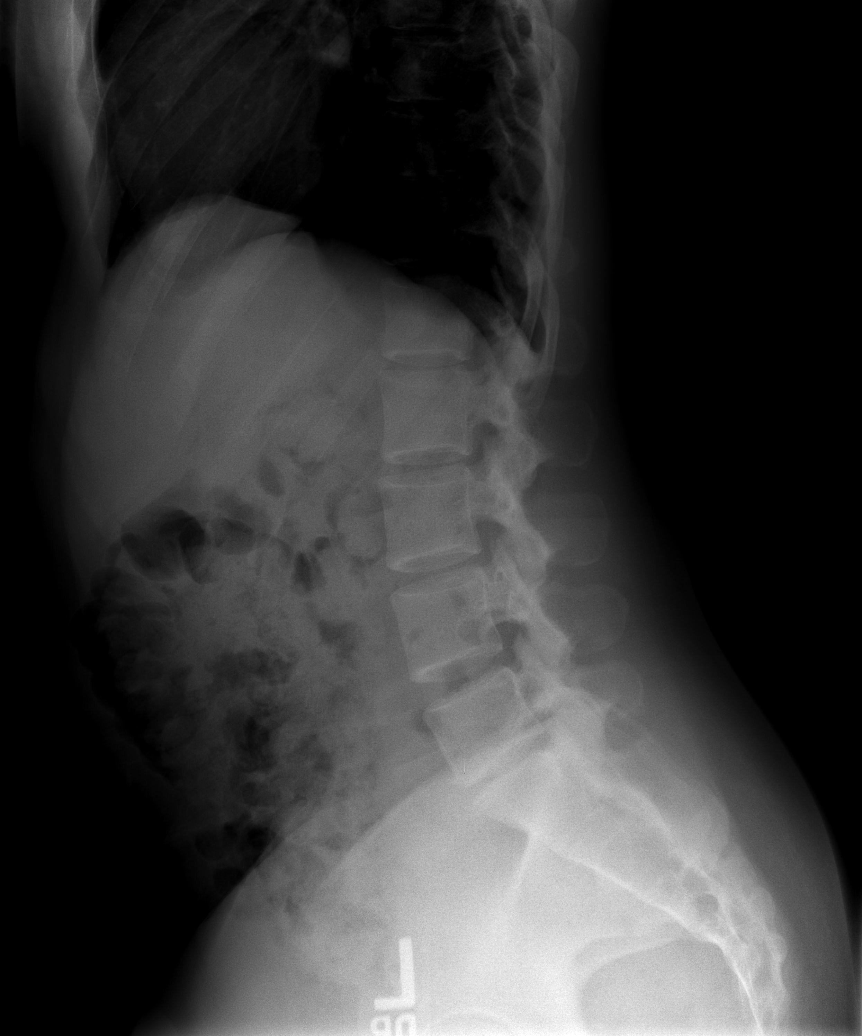

[t l-spine l5-s1 spot]
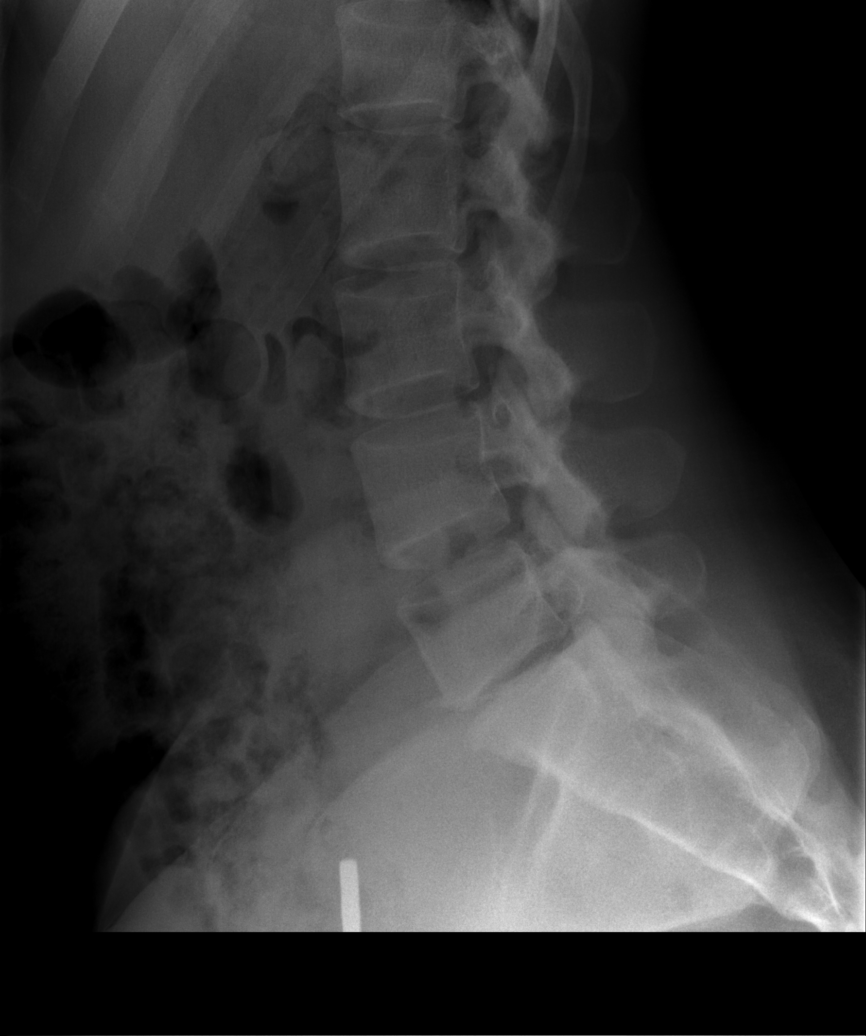

[5 of 5 positions shown; findings below may reference images not displayed]

FINDINGS: Lumbar alignment within normal limits. Vertebral body heights are
maintained. Mild disc space narrowing at L5-S1. Mild facet
degenerative change of the lower lumbar spine
IMPRESSION: Mild degenerative changes without acute osseous abnormality.

## 2020-08-14 IMAGING — CR DG PELVIS 1-2V
1 series · 1 of 1 positions shown · non-contrast
Comparison: None.

CLINICAL DATA: Right leg pain

EXAM:
PELVIS - 1-2 VIEW

[t pelvis a.p.]
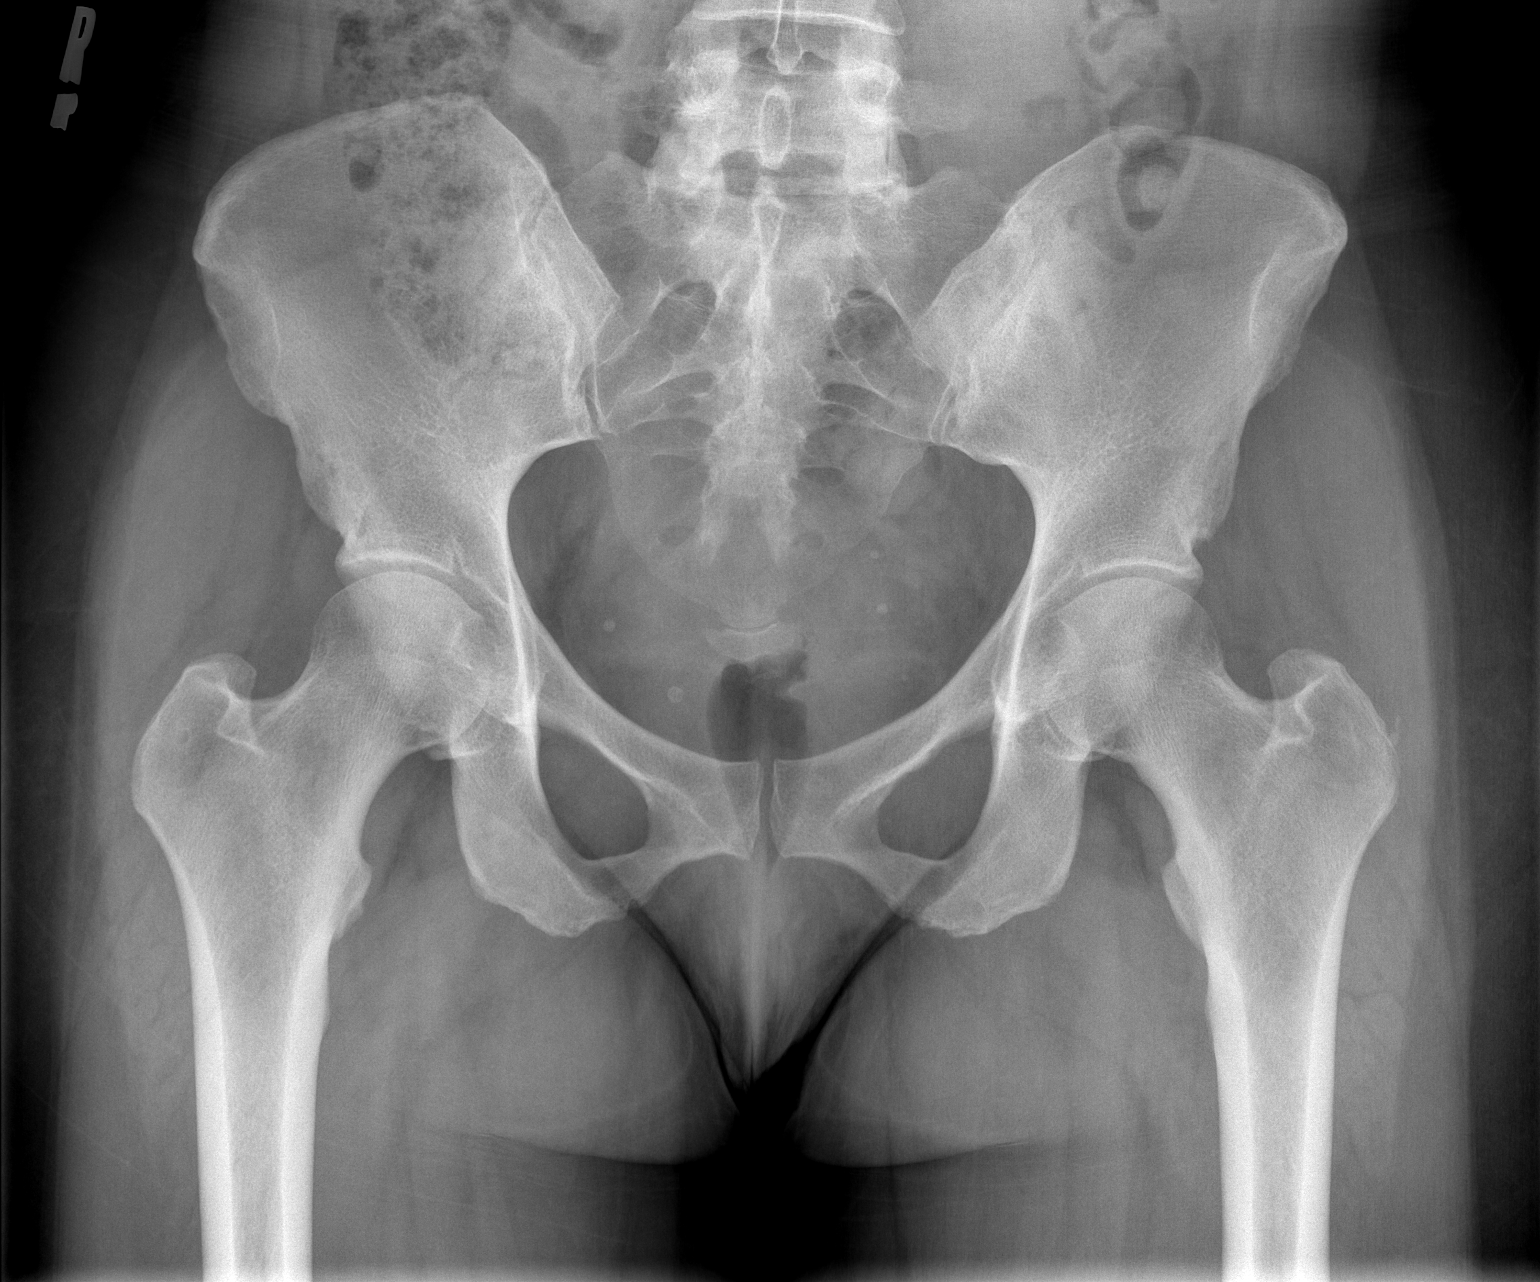

[1 of 1 positions shown; findings below may reference images not displayed]

FINDINGS: There is no evidence of pelvic fracture or diastasis. No pelvic bone
lesions are seen. Phleboliths in the pelvis
IMPRESSION: Negative.

## 2021-01-13 ENCOUNTER — Encounter (HOSPITAL_COMMUNITY): Payer: Self-pay | Admitting: Emergency Medicine

## 2021-01-13 ENCOUNTER — Other Ambulatory Visit: Payer: Self-pay

## 2021-01-13 ENCOUNTER — Ambulatory Visit (HOSPITAL_COMMUNITY)
Admission: EM | Admit: 2021-01-13 | Discharge: 2021-01-13 | Disposition: A | Discharge status: Home / Self Care | Payer: No Typology Code available for payment source | Attending: Urgent Care | Admitting: Urgent Care

## 2021-01-13 DIAGNOSIS — Z23 Encounter for immunization: Secondary | ICD-10-CM | POA: Diagnosis not present

## 2021-01-13 DIAGNOSIS — M79642 Pain in left hand: Secondary | ICD-10-CM | POA: Diagnosis not present

## 2021-01-13 DIAGNOSIS — S61412A Laceration without foreign body of left hand, initial encounter: Secondary | ICD-10-CM

## 2021-01-13 MED ORDER — TETANUS-DIPHTH-ACELL PERTUSSIS 5-2.5-18.5 LF-MCG/0.5 IM SUSY
PREFILLED_SYRINGE | INTRAMUSCULAR | Status: AC
  Filled 2021-01-13: qty 0.5

## 2021-01-13 MED ORDER — TETANUS-DIPHTH-ACELL PERTUSSIS 5-2.5-18.5 LF-MCG/0.5 IM SUSY
0.5000 mL | PREFILLED_SYRINGE | Freq: Once | INTRAMUSCULAR | Status: AC
  Administered 2021-01-13: 0.5 mL via INTRAMUSCULAR

## 2021-01-13 NOTE — ED Provider Notes (Signed)
[facility]   MRN: [id number] DOB: 28-Feb-1971  Subjective:   Jenny Wang is a 50 y.o. female presenting for suffering a laceration to the left hand in between the fourth and fifth fingers while washing a dish.  Denies loss of range of motion, loss of sensation.  Cannot recall her last Tdap.  No current facility-administered medications for this encounter.  Current Outpatient Medications:  .  ciprofloxacin (CIPRO) 500 MG tablet, Take 1 tablet (500 mg total) by mouth 2 (two) times daily. (Patient not taking: Reported on 03/05/2016), Disp: 28 tablet, Rfl: 0 .  cyclobenzaprine (FLEXERIL) 5 MG tablet, Take 1 tablet (5 mg total) by mouth 3 (three) times daily as needed for muscle spasms., Disp: 30 tablet, Rfl: 0 .  HYDROcodone-acetaminophen (NORCO/VICODIN) 5-325 MG per tablet, Take 2 tablets by mouth every 6 (six) hours as needed for moderate pain. (Patient not taking: Reported on 03/05/2016), Disp: 12 tablet, Rfl: 0 .  ibuprofen (ADVIL,MOTRIN) 200 MG tablet, Take 200 mg by mouth every 6 (six) hours as needed for mild pain or moderate pain., Disp: , Rfl:  .  lisinopril-hydrochlorothiazide (PRINZIDE,ZESTORETIC) 10-12.5 MG tablet, Take 1 tablet by mouth daily. , Disp: , Rfl:  .  metroNIDAZOLE (FLAGYL) 500 MG tablet, Take 1 tablet (500 mg total) by mouth 2 (two) times daily. (Patient not taking: Reported on 03/05/2016), Disp: 28 tablet, Rfl: 0 .  Multiple Vitamins-Minerals (MULTIVITAMIN & MINERAL PO), Take 1 tablet by mouth daily., Disp: , Rfl:  .  naproxen (NAPROSYN) 375 MG tablet, Take 1 tablet (375 mg total) by mouth 2 (two) times daily., Disp: 20 tablet, Rfl: 0 .  ondansetron (ZOFRAN) 4 MG tablet, Take 1 tablet (4 mg total) by mouth every 6 (six) hours. (Patient not taking: Reported on 03/05/2016), Disp: 12 tablet, Rfl: 0   Allergies  Allergen Reactions  . Shellfish-Derived Products     Past Medical History:  Diagnosis Date  . Enlarged pituitary gland (HCC)   . Hypertension       History reviewed. No pertinent surgical history.  Family History  Problem Relation Age of Onset  . Hypertension Mother   . Hypertension Father     Social History   Tobacco Use  . Smoking status: Never Smoker  . Smokeless tobacco: Never Used  Substance Use Topics  . Alcohol use: No  . Drug use: No    ROS   Objective:   Vitals: BP (!) 144/79 (BP Location: Left Arm)   Pulse 66   Temp 98.4 F (36.9 C) (Oral)   Resp 18   LMP 01/11/2021   SpO2 97%   Physical Exam Constitutional:      General: She is not in acute distress.    Appearance: Normal appearance. She is well-developed. She is not ill-appearing.  HENT:     Head: Normocephalic and atraumatic.     Nose: Nose normal.     Mouth/Throat:     Mouth: Mucous membranes are moist.     Pharynx: Oropharynx is clear.  Eyes:     General: No scleral icterus.    Extraocular Movements: Extraocular movements intact.     Pupils: Pupils are equal, round, and reactive to light.  Cardiovascular:     Rate and Rhythm: Normal rate.  Pulmonary:     Effort: Pulmonary effort is normal.  Musculoskeletal:       Hands:  Skin:    General: Skin is warm and dry.  Neurological:     General: No  focal deficit present.     Mental Status: She is alert and oriented to person, place, and time.  Psychiatric:        Mood and Affect: Mood normal.        Behavior: Behavior normal.     PROCEDURE NOTE: laceration repair Verbal consent obtained from patient.  Local anesthesia with 3cc Lidocaine 1% without epinephrine.  Wound explored for tendon, ligament damage. Wound scrubbed with soap and water and rinsed. Wound closed with #2 4-0 Prolene (simple interrupted) sutures.  Wound cleansed and dressed.   Assessment and Plan :   PDMP not reviewed this encounter.  1. Laceration of left hand, foreign body presence unspecified, initial encounter   2. Left hand pain   3. Need for diphtheria-tetanus-pertussis (Tdap) vaccine     Laceration  repaired successfully.  Patient maintained range of motion both prior to and after laceration repair.  Wound care reviewed. Recommended Tylenol and/or ibuprofen for pain control.  Tdap updated.  Return-to-clinic precautions discussed, patient verbalized understanding. Otherwise, follow up in 10-14 days for suture removal.     Wallis Bamberg, PA-C 01/13/21 1940

## 2021-01-13 NOTE — Discharge Instructions (Signed)
WOUND CARE Please return in 10-14 days to have your stitches/staples removed or sooner if you have concerns.  Keep area clean and dry for 24 hours. Do not remove bandage, if applied.  After 24 hours, remove bandage and wash wound gently with mild soap and warm water. Reapply a new bandage after cleaning wound, if directed.  Continue daily cleansing with soap and water until stitches/staples are removed.  Do not apply any ointments or creams to the wound while stitches/staples are in place, as this may cause delayed healing.  Notify the office if you experience any of the following signs of infection: Swelling, redness, pus drainage, streaking, fever >101.0 F  Notify the office if you experience excessive bleeding that does not stop after 15-20 minutes of constant, firm pressure.  

## 2021-01-27 ENCOUNTER — Ambulatory Visit (HOSPITAL_COMMUNITY)
Admission: EM | Admit: 2021-01-27 | Discharge: 2021-01-27 | Disposition: A | Discharge status: Home / Self Care | Payer: No Typology Code available for payment source | Attending: Internal Medicine | Admitting: Internal Medicine

## 2021-01-27 ENCOUNTER — Other Ambulatory Visit: Payer: Self-pay

## 2021-01-27 DIAGNOSIS — Z4802 Encounter for removal of sutures: Secondary | ICD-10-CM

## 2021-01-27 NOTE — ED Notes (Signed)
@   sutures removed from between the RT ring finger and rt small finger. Kine dry and intact. No signs of infection.

## 2021-01-27 NOTE — ED Triage Notes (Signed)
PT for suture removal  RT hand

## 2023-10-07 ENCOUNTER — Other Ambulatory Visit: Payer: Self-pay | Admitting: Family Medicine

## 2023-10-07 ENCOUNTER — Other Ambulatory Visit (HOSPITAL_COMMUNITY)
Admission: RE | Admit: 2023-10-07 | Discharge: 2023-10-07 | Disposition: A | Discharge status: Home / Self Care | Payer: Self-pay | Source: Ambulatory Visit | Attending: Family Medicine | Admitting: Family Medicine

## 2023-10-07 DIAGNOSIS — R109 Unspecified abdominal pain: Secondary | ICD-10-CM

## 2023-10-07 DIAGNOSIS — Z113 Encounter for screening for infections with a predominantly sexual mode of transmission: Secondary | ICD-10-CM | POA: Insufficient documentation

## 2023-10-07 DIAGNOSIS — Z124 Encounter for screening for malignant neoplasm of cervix: Secondary | ICD-10-CM | POA: Diagnosis present

## 2023-10-11 LAB — MOLECULAR ANCILLARY ONLY
Bacterial Vaginitis (gardnerella): POSITIVE — AB
Candida Glabrata: NEGATIVE
Candida Vaginitis: NEGATIVE
Chlamydia: NEGATIVE
Comment: NEGATIVE
Comment: NEGATIVE
Comment: NEGATIVE
Comment: NEGATIVE
Comment: NEGATIVE
Comment: NORMAL
Neisseria Gonorrhea: NEGATIVE
Trichomonas: NEGATIVE

## 2023-10-17 LAB — CYTOLOGY - PAP
Adequacy: ABSENT
Comment: NEGATIVE
Diagnosis: NEGATIVE
High risk HPV: NEGATIVE

## 2023-10-28 NOTE — Progress Notes (Signed)
 Jenny Wang MRN: [id number] DOB: [birth year] (age: 53 y.o.)  Is in clinic today for follow up.  Follow-up on her right carpal tunnel expected syndrome.  She has had a nerve conduction test done.  She continues to endorse nocturnal symptoms.  She is now wearing the braces at night and states she has had significant reduction of her symptoms.  She really only has nocturnal symptoms.  On exam this is a well-nourished well-developed individual in no acute distress.  They are alert and oriented x3.  Mood and affect are age-appropriate.  They are cooperative with the exam.  They ambulate without difficulty or assistive device.  Head is normocephalic atraumatic.  Neck is supple without meningismus.  Cranial nerves II through XII are grossly intact without deficit.  Skin is warm dry and intact without any signs of infection.  Musculoskeletal examination of the right hand: Overlying skin is warm dry and intact.  There is no signs of rash irritation or infection.  There is no obvious deformity.  No obvious nail deformities.  Capillary refill is brisk and skin turgor is appropriate.  Neurovascular she is intact.  Can make a composite fist when compared bilaterally.  Fingers are warm and pink. Positive Tinel's and Phalen's at the wrist on the right.  No obvious thenar atrophy.  Impression/plan  Nerve conduction test reviewed the patient in great detail.  This shows a pretty severe carpal tunnel syndrome with no response to the sensory nerve.  I did explain to her in great detail.  At this point time would recommend surgical release to help prevent any additional demyelination.  She really only has nocturnal symptoms and like to go home and take about this and she will call and let us  know.   1. Right carpal tunnel syndrome        y. We had an extensive conversation about what to expect during the pre op, intra op, and post op periods.  All risks of surgery were discussed with the patient.  These risks were  included but not limited to failure of surgery, need for further surgery.  Damage to surrounding tissue, recurrence of symptoms after surgery, heart attack, stroke, death, incomplete resolution of symptoms. If needed: Patient is appropriate of ambulatory surgery setting.

## 2024-04-12 ENCOUNTER — Other Ambulatory Visit: Payer: Self-pay | Admitting: Orthopedic Surgery

## 2024-04-13 ENCOUNTER — Encounter (HOSPITAL_BASED_OUTPATIENT_CLINIC_OR_DEPARTMENT_OTHER): Payer: Self-pay | Admitting: Orthopedic Surgery

## 2024-04-13 ENCOUNTER — Other Ambulatory Visit: Payer: Self-pay

## 2024-04-19 ENCOUNTER — Encounter (HOSPITAL_BASED_OUTPATIENT_CLINIC_OR_DEPARTMENT_OTHER)
Admission: RE | Admit: 2024-04-19 | Discharge: 2024-04-19 | Disposition: A | Discharge status: Home / Self Care | Source: Ambulatory Visit | Attending: Orthopedic Surgery | Admitting: Orthopedic Surgery

## 2024-04-19 DIAGNOSIS — I10 Essential (primary) hypertension: Secondary | ICD-10-CM | POA: Diagnosis not present

## 2024-04-19 DIAGNOSIS — Z01818 Encounter for other preprocedural examination: Secondary | ICD-10-CM | POA: Diagnosis present

## 2024-04-19 LAB — BASIC METABOLIC PANEL WITH GFR
Anion gap: 8 (ref 5–15)
BUN: 8 mg/dL (ref 6–20)
CO2: 28 mmol/L (ref 22–32)
Calcium: 9.9 mg/dL (ref 8.9–10.3)
Chloride: 104 mmol/L (ref 98–111)
Creatinine, Ser: 1.03 mg/dL — ABNORMAL HIGH (ref 0.44–1.00)
GFR, Estimated: >60 mL/min (ref >60)
Glucose, Bld: 92 mg/dL (ref 70–99)
Potassium: 3.9 mmol/L (ref 3.5–5.1)
Sodium: 140 mmol/L (ref 135–145)

## 2024-04-23 ENCOUNTER — Ambulatory Visit (HOSPITAL_BASED_OUTPATIENT_CLINIC_OR_DEPARTMENT_OTHER): Admitting: Certified Registered"

## 2024-04-23 ENCOUNTER — Encounter (HOSPITAL_BASED_OUTPATIENT_CLINIC_OR_DEPARTMENT_OTHER)
Admission: RE | Disposition: A | Discharge status: Home / Self Care | Payer: Self-pay | Source: Home / Self Care | Attending: Orthopedic Surgery

## 2024-04-23 ENCOUNTER — Encounter (HOSPITAL_BASED_OUTPATIENT_CLINIC_OR_DEPARTMENT_OTHER): Payer: Self-pay | Admitting: Orthopedic Surgery

## 2024-04-23 ENCOUNTER — Ambulatory Visit (HOSPITAL_BASED_OUTPATIENT_CLINIC_OR_DEPARTMENT_OTHER)
Admission: RE | Admit: 2024-04-23 | Discharge: 2024-04-23 | Disposition: A | Discharge status: Home / Self Care | Attending: Orthopedic Surgery | Admitting: Orthopedic Surgery

## 2024-04-23 DIAGNOSIS — I10 Essential (primary) hypertension: Secondary | ICD-10-CM | POA: Diagnosis not present

## 2024-04-23 DIAGNOSIS — G5601 Carpal tunnel syndrome, right upper limb: Secondary | ICD-10-CM | POA: Insufficient documentation

## 2024-04-23 DIAGNOSIS — M1990 Unspecified osteoarthritis, unspecified site: Secondary | ICD-10-CM | POA: Insufficient documentation

## 2024-04-23 HISTORY — DX: Unspecified osteoarthritis, unspecified site: M19.90

## 2024-04-23 HISTORY — PX: CARPAL TUNNEL RELEASE: SHX101

## 2024-04-23 HISTORY — DX: Anxiety disorder, unspecified: F41.9

## 2024-04-23 LAB — POCT PREGNANCY, URINE: Preg Test, Ur: NEGATIVE

## 2024-04-23 SURGERY — CARPAL TUNNEL RELEASE
Anesthesia: General | Site: Wrist | Laterality: Right

## 2024-04-23 MED ORDER — MIDAZOLAM HCL 5 MG/5ML IJ SOLN
INTRAMUSCULAR | Status: DC | PRN
  Administered 2024-04-23: 2 mg via INTRAVENOUS

## 2024-04-23 MED ORDER — BUPIVACAINE HCL (PF) 0.25 % IJ SOLN
INTRAMUSCULAR | Status: DC | PRN
Start: 2024-04-23 — End: 2024-04-23
  Administered 2024-04-23: 9 mL

## 2024-04-23 MED ORDER — LACTATED RINGERS IV SOLN: INTRAVENOUS | Status: DC

## 2024-04-23 MED ORDER — DEXAMETHASONE SODIUM PHOSPHATE 10 MG/ML IJ SOLN
INTRAMUSCULAR | Status: AC
  Filled 2024-04-23: qty 1

## 2024-04-23 MED ORDER — SODIUM CHLORIDE 0.9 % IV SOLN
12.5000 mg | INTRAVENOUS | Status: DC | PRN
  Filled 2024-04-23: qty 0.5

## 2024-04-23 MED ORDER — FENTANYL CITRATE (PF) 100 MCG/2ML IJ SOLN
INTRAMUSCULAR | Status: AC
  Filled 2024-04-23: qty 2

## 2024-04-23 MED ORDER — AMISULPRIDE (ANTIEMETIC) 5 MG/2ML IV SOLN: 10.0000 mg | Freq: Once | INTRAVENOUS | Status: DC | PRN

## 2024-04-23 MED ORDER — ONDANSETRON HCL 4 MG/2ML IJ SOLN
INTRAMUSCULAR | Status: DC | PRN
Start: 2024-04-23 — End: 2024-04-23
  Administered 2024-04-23: 4 mg via INTRAVENOUS

## 2024-04-23 MED ORDER — CEFAZOLIN SODIUM-DEXTROSE 2-4 GM/100ML-% IV SOLN
2.0000 g | INTRAVENOUS | Status: AC
  Administered 2024-04-23: 2 g via INTRAVENOUS

## 2024-04-23 MED ORDER — HYDROMORPHONE HCL 1 MG/ML IJ SOLN
INTRAMUSCULAR | Status: AC
  Filled 2024-04-23: qty 0.5

## 2024-04-23 MED ORDER — LIDOCAINE HCL (CARDIAC) PF 100 MG/5ML IV SOSY
PREFILLED_SYRINGE | INTRAVENOUS | Status: DC | PRN
  Administered 2024-04-23: 60 mg via INTRAVENOUS

## 2024-04-23 MED ORDER — OXYCODONE HCL 5 MG/5ML PO SOLN: 5.0000 mg | Freq: Once | ORAL | Status: DC | PRN

## 2024-04-23 MED ORDER — HYDROMORPHONE HCL 1 MG/ML IJ SOLN
0.2500 mg | INTRAMUSCULAR | Status: DC | PRN
  Administered 2024-04-23: 0.5 mg via INTRAVENOUS

## 2024-04-23 MED ORDER — MIDAZOLAM HCL 2 MG/2ML IJ SOLN
INTRAMUSCULAR | Status: AC
  Filled 2024-04-23: qty 2

## 2024-04-23 MED ORDER — PROPOFOL 10 MG/ML IV BOLUS
INTRAVENOUS | Status: DC | PRN
  Administered 2024-04-23: 200 mg via INTRAVENOUS

## 2024-04-23 MED ORDER — OXYCODONE HCL 5 MG PO TABS: 5.0000 mg | ORAL_TABLET | Freq: Once | ORAL | Status: DC | PRN

## 2024-04-23 MED ORDER — MEPERIDINE HCL 25 MG/ML IJ SOLN: 6.2500 mg | INTRAMUSCULAR | Status: DC | PRN

## 2024-04-23 MED ORDER — LIDOCAINE 2% (20 MG/ML) 5 ML SYRINGE
INTRAMUSCULAR | Status: AC
  Filled 2024-04-23: qty 5

## 2024-04-23 MED ORDER — DEXAMETHASONE SODIUM PHOSPHATE 4 MG/ML IJ SOLN
INTRAMUSCULAR | Status: DC | PRN
  Administered 2024-04-23: 5 mg via INTRAVENOUS

## 2024-04-23 MED ORDER — HYDROCODONE-ACETAMINOPHEN 5-325 MG PO TABS: 1.0000 | ORAL_TABLET | Freq: Four times a day (QID) | ORAL | 0 refills | Status: AC | PRN

## 2024-04-23 MED ORDER — FENTANYL CITRATE (PF) 100 MCG/2ML IJ SOLN
INTRAMUSCULAR | Status: DC | PRN
  Administered 2024-04-23 (×4): 25 ug via INTRAVENOUS

## 2024-04-23 MED ORDER — ONDANSETRON HCL 4 MG/2ML IJ SOLN
INTRAMUSCULAR | Status: AC
  Filled 2024-04-23: qty 2

## 2024-04-23 SURGICAL SUPPLY — 29 items
BLADE SURG 15 STRL LF DISP TIS (BLADE) ×2 IMPLANT
BNDG COMPR ESMARK 4X3 LF (GAUZE/BANDAGES/DRESSINGS) IMPLANT
BNDG ELASTIC 3INX 5YD STR LF (GAUZE/BANDAGES/DRESSINGS) ×1 IMPLANT
BNDG GAUZE DERMACEA FLUFF 4 (GAUZE/BANDAGES/DRESSINGS) ×1 IMPLANT
CHLORAPREP W/TINT 26 (MISCELLANEOUS) ×1 IMPLANT
CORD BIPOLAR FORCEPS 12FT (ELECTRODE) ×1 IMPLANT
COVER BACK TABLE 60X90IN (DRAPES) ×1 IMPLANT
COVER MAYO STAND STRL (DRAPES) ×1 IMPLANT
CUFF TOURN SGL QUICK 18X4 (TOURNIQUET CUFF) ×1 IMPLANT
DRAPE EXTREMITY T 121X128X90 (DISPOSABLE) ×1 IMPLANT
DRAPE SURG 17X23 STRL (DRAPES) ×1 IMPLANT
GAUZE PAD ABD 8X10 STRL (GAUZE/BANDAGES/DRESSINGS) ×1 IMPLANT
GAUZE SPONGE 4X4 12PLY STRL (GAUZE/BANDAGES/DRESSINGS) ×1 IMPLANT
GAUZE XEROFORM 1X8 LF (GAUZE/BANDAGES/DRESSINGS) ×1 IMPLANT
GLOVE BIO SURGEON STRL SZ7.5 (GLOVE) ×1 IMPLANT
GLOVE BIOGEL PI IND STRL 8 (GLOVE) ×1 IMPLANT
GOWN STRL REUS W/ TWL LRG LVL3 (GOWN DISPOSABLE) ×1 IMPLANT
GOWN STRL REUS W/TWL XL LVL3 (GOWN DISPOSABLE) ×1 IMPLANT
NDL HYPO 25X1 1.5 SAFETY (NEEDLE) ×1 IMPLANT
NEEDLE HYPO 25X1 1.5 SAFETY (NEEDLE) ×1 IMPLANT
NS IRRIG 1000ML POUR BTL (IV SOLUTION) ×1 IMPLANT
PACK BASIN DAY SURGERY FS (CUSTOM PROCEDURE TRAY) ×1 IMPLANT
PADDING CAST ABS COTTON 4X4 ST (CAST SUPPLIES) ×1 IMPLANT
STOCKINETTE 4X48 STRL (DRAPES) ×1 IMPLANT
SUT ETHILON 4 0 PS 2 18 (SUTURE) ×1 IMPLANT
SYR BULB EAR ULCER 3OZ GRN STR (SYRINGE) ×1 IMPLANT
SYR CONTROL 10ML LL (SYRINGE) ×1 IMPLANT
TOWEL GREEN STERILE FF (TOWEL DISPOSABLE) ×2 IMPLANT
UNDERPAD 30X36 HEAVY ABSORB (UNDERPADS AND DIAPERS) ×1 IMPLANT

## 2024-04-23 NOTE — Anesthesia Postprocedure Evaluation (Signed)
 Anesthesia Post Note  Patient: Jenny Wang  Procedure(s) Performed: CARPAL TUNNEL RELEASE (Right: Wrist)     Patient location during evaluation: PACU Anesthesia Type: General Level of consciousness: awake and alert Pain management: pain level controlled Vital Signs Assessment: post-procedure vital signs reviewed and stable Respiratory status: spontaneous breathing, nonlabored ventilation and respiratory function stable Cardiovascular status: blood pressure returned to baseline and stable Postop Assessment: no apparent nausea or vomiting Anesthetic complications: no   No notable events documented.  Last Vitals:  Vitals:   04/23/24 1545 04/23/24 1600  BP: 105/75 (!) 155/95  Pulse: (!) 52 68  Resp: 12 14  Temp:  36.6 C  SpO2: 93% 97%    Last Pain:  Vitals:   04/23/24 1600  TempSrc: Temporal  PainSc: 0-No pain                 Butler Levander Pinal

## 2024-04-23 NOTE — Anesthesia Preprocedure Evaluation (Signed)
 Anesthesia Evaluation  Patient identified by MRN, date of birth, ID band Patient awake    Reviewed: Allergy & Precautions, H&P , NPO status , Patient's Chart, lab work & pertinent test results  Airway Mallampati: II  TM Distance: >3 FB Neck ROM: Full    Dental no notable dental hx.    Pulmonary neg pulmonary ROS   Pulmonary exam normal breath sounds clear to auscultation       Cardiovascular hypertension, Pt. on medications negative cardio ROS Normal cardiovascular exam Rhythm:Regular Rate:Normal     Neuro/Psych   Anxiety     negative neurological ROS  negative psych ROS   GI/Hepatic negative GI ROS, Neg liver ROS,,,  Endo/Other  negative endocrine ROS    Renal/GU negative Renal ROS  negative genitourinary   Musculoskeletal  (+) Arthritis , Osteoarthritis,    Abdominal   Peds negative pediatric ROS (+)  Hematology negative hematology ROS (+)   Anesthesia Other Findings   Reproductive/Obstetrics negative OB ROS                              Anesthesia Physical Anesthesia Plan  ASA: 2  Anesthesia Plan: General   Post-op Pain Management: Minimal or no pain anticipated   Induction: Intravenous  PONV Risk Score and Plan: 3 and Ondansetron , Dexamethasone , Midazolam  and Treatment may vary due to age or medical condition  Airway Management Planned: LMA  Additional Equipment:   Intra-op Plan:   Post-operative Plan: Extubation in OR  Informed Consent: I have reviewed the patients History and Physical, chart, labs and discussed the procedure including the risks, benefits and alternatives for the proposed anesthesia with the patient or authorized representative who has indicated his/her understanding and acceptance.     Dental advisory given  Plan Discussed with: CRNA  Anesthesia Plan Comments:         Anesthesia Quick Evaluation

## 2024-04-23 NOTE — Discharge Instructions (Addendum)

## 2024-04-23 NOTE — Op Note (Signed)
 04/23/2024 [facility]                              OPERATIVE REPORT   PREOPERATIVE DIAGNOSIS:  Right carpal tunnel syndrome  POSTOPERATIVE DIAGNOSIS:  Right carpal tunnel syndrome  PROCEDURE:  Right carpal tunnel release  SURGEON:  Franky Curia, MD  ASSISTANT:  none.  ANESTHESIA: General  IV FLUIDS:  Per anesthesia flow sheet  ESTIMATED BLOOD LOSS:  Minimal  COMPLICATIONS:  None  SPECIMENS:  None  TOURNIQUET TIME:    Total Tourniquet Time Documented: Upper Arm (Right) - 12 minutes Total: Upper Arm (Right) - 12 minutes   DISPOSITION:  Stable to PACU  LOCATION:  SURGERY CENTER  INDICATIONS:  53 y.o. yo female with numbness and tingling right hand.  Nocturnal symptoms. Positive nerve conduction studies. She wishes to proceed with right carpal tunnel release.  Risks, benefits and alternatives of surgery were discussed including the risk of blood loss; infection; damage to nerves, vessels, tendons, ligaments, bone; failure of surgery; need for additional surgery; complications with wound healing; continued pain; recurrence of carpal tunnel syndrome; and damage to motor branch. She voiced understanding of these risks and elected to proceed.   OPERATIVE COURSE:  After being identified preoperatively by myself, the patient and I agreed upon the procedure and site of procedure.  The surgical site was marked.  Surgical consent had been signed.  She was given IV Ancef  as preoperative antibiotic prophylaxis.  She was transferred to the operating room and placed on the operating room table in supine position with the Right upper extremity on an armboard.  General anesthesia was induced by the anesthesiologist.  Right upper extremity was prepped and draped in normal sterile orthopaedic fashion.  A surgical pause was performed between the surgeons, anesthesia, and operating room staff, and all were in agreement as to the patient, procedure, and site of procedure.   Tourniquet at the proximal aspect of the extremity was inflated to 250 mmHg after exsanguination of the arm with an Esmarch bandage  Incision was made over the transverse carpal ligament and carried into the subcutaneous tissues by spreading technique.  Bipolar electrocautery was used to obtain hemostasis.  The palmar fascia was sharply incised.  The transverse carpal ligament was identified.  The fascia distal to the ligament was opened.  Retractor was placed and the flexor tendons were identified.  The flexor tendon to the little finger was identified and retracted radially.  The transverse carpal ligament was then incised from distal to proximal under direct visualization.  Scissors were used to split the distal aspect of the volar antebrachial fascia.  A finger was placed into the wound to ensure complete decompression, which was the case.  The nerve was examined.  It was adherent to the radial leaflet.  The motor branch was identified and was intact.  The wound was copiously irrigated with sterile saline.  It was then closed with 4-0 nylon in a horizontal mattress fashion.  It was injected with 0.25% plain Marcaine  to aid in postoperative analgesia.  It was dressed with sterile Xeroform, 4x4s, an ABD, and wrapped with Kerlix and an Ace bandage.  Tourniquet was deflated at 12 minutes.  Fingertips were pink with brisk capillary refill after deflation of the tourniquet.  Operative drapes were broken down.  The patient was awoken from anesthesia safely.  She was transferred back to stretcher and taken to the PACU in stable  condition.  I will see her back in the office in 1 week for postoperative followup.  I will give her a prescription for Norco 5/325 1 tab PO q6 hours prn pain, dispense #15.    Jenny Ingman, MD Electronically signed, 04/23/24

## 2024-04-23 NOTE — Anesthesia Procedure Notes (Signed)
 Procedure Name: LMA Insertion Date/Time: 04/23/2024 1:56 PM  Performed by: Pam Macario BROCKS, CRNAPre-anesthesia Checklist: Patient identified, Emergency Drugs available, Suction available, Patient being monitored and Timeout performed Patient Re-evaluated:Patient Re-evaluated prior to induction Oxygen Delivery Method: Circle system utilized Preoxygenation: Pre-oxygenation with 100% oxygen Induction Type: IV induction Ventilation: Mask ventilation without difficulty LMA: LMA inserted LMA Size: 4.0 Number of attempts: 1 Airway Equipment and Method: Bite block Placement Confirmation: positive ETCO2, breath sounds checked- equal and bilateral and CO2 detector Tube secured with: Tape Dental Injury: Teeth and Oropharynx as per pre-operative assessment

## 2024-04-23 NOTE — Transfer of Care (Signed)
 Immediate Anesthesia Transfer of Care Note  Patient: Jenny Wang  Procedure(s) Performed: CARPAL TUNNEL RELEASE (Right: Wrist)  Patient Location: PACU  Anesthesia Type:MAC and General  Level of Consciousness: drowsy  Airway & Oxygen Therapy: Patient Spontanous Breathing and Patient connected to nasal cannula oxygen  Post-op Assessment: Report given to RN  Post vital signs: stable  Last Vitals:  Vitals Value Taken Time  BP 143/99 04/23/24 14:30  Temp    Pulse 52 04/23/24 14:32  Resp 10 04/23/24 14:32  SpO2 99 % 04/23/24 14:32  Vitals shown include unfiled device data.  Last Pain:  Vitals:   04/23/24 1233  TempSrc: Temporal  PainSc: 0-No pain      Patients Stated Pain Goal: 3 (04/23/24 1233)  Complications: No notable events documented.

## 2024-04-23 NOTE — H&P (Addendum)
 Jenny Wang is an 53 y.o. female.   Chief Complaint: carpal tunnel syndrome HPI: 53 y.o. yo female with numbness and tingling right hand.  Nocturnal symptoms. Positive nerve conduction studies. She wishes to have right carpal tunnel release.   Allergies:  Allergies  Allergen Reactions   Shellfish-Derived Products Swelling    Vaginal swelling     Past Medical History:  Diagnosis Date   Anxiety    Arthritis    back   Enlarged pituitary gland (HCC)    Hypertension     Past Surgical History:  Procedure Laterality Date   COLONOSCOPY      Family History: Family History  Problem Relation Age of Onset   Hypertension Mother    Hypertension Father     Social History:   reports that she has never smoked. She has never used smokeless tobacco. She reports that she does not drink alcohol and does not use drugs.  Medications: Medications Prior to Admission  Medication Sig Dispense Refill   ALPRAZolam (XANAX) 0.25 MG tablet Take 0.25 mg by mouth daily.     amLODipine (NORVASC) 5 MG tablet Take 5 mg by mouth daily.     Multiple Vitamins-Minerals (MULTIVITAMIN & MINERAL PO) Take 1 tablet by mouth daily.     valsartan-hydrochlorothiazide (DIOVAN-HCT) 80-12.5 MG tablet Take 1 tablet by mouth daily.      Results for orders placed or performed during the hospital encounter of 04/23/24 (from the past 48 hours)  Pregnancy, urine POC     Status: None   Collection Time: 04/23/24 12:22 PM  Result Value Ref Range   Preg Test, Ur NEGATIVE NEGATIVE    Comment:        THE SENSITIVITY OF THIS METHODOLOGY IS >20 mIU/mL.     No results found.    Blood pressure 135/88, pulse 67, temperature 98.2 F (36.8 C), temperature source Temporal, height 5' 5 (1.651 m), weight 72.6 kg, last menstrual period 04/07/2024, SpO2 99%.  General appearance: alert, cooperative, and appears stated age Head: Normocephalic, without obvious abnormality, atraumatic Neck: supple, symmetrical, trachea  midline Extremities: Intact sensation and capillary refill all digits.  +epl/fpl/io.  No wounds.  Skin: Skin color, texture, turgor normal. No rashes or lesions Neurologic: Grossly normal Incision/Wound: none  Assessment/Plan Right carpal tunnel syndrome.  Non operative and operative treatment options have been discussed with the patient and patient wishes to proceed with operative treatment. Risks, benefits and alternatives of surgery were discussed including risks of blood loss, infection, damage to nerves/vessels/tendons/ligament/bone, failure of surgery, need for additional surgery, complication with wound healing, stiffness, damage to motor branch, recurrence.  She voiced understanding of these risks and elected to proceed.    Jenny Wang 04/23/2024, 1:33 PM

## 2024-04-24 ENCOUNTER — Encounter (HOSPITAL_BASED_OUTPATIENT_CLINIC_OR_DEPARTMENT_OTHER): Payer: Self-pay | Admitting: Orthopedic Surgery

## 2024-07-19 ENCOUNTER — Other Ambulatory Visit: Payer: Self-pay | Admitting: Family Medicine

## 2024-07-19 DIAGNOSIS — K5730 Diverticulosis of large intestine without perforation or abscess without bleeding: Secondary | ICD-10-CM
